# Patient Record
Sex: Female | Born: 1975 | Race: White | Hispanic: No | Marital: Married | Smoking: Never smoker
Health system: Southern US, Community
[De-identification: ages and names within clinical notes are randomized; demographics above are authoritative.]

## PROBLEM LIST (undated history)

## (undated) DIAGNOSIS — N939 Abnormal uterine and vaginal bleeding, unspecified: Secondary | ICD-10-CM

## (undated) DIAGNOSIS — E079 Disorder of thyroid, unspecified: Secondary | ICD-10-CM

## (undated) HISTORY — PX: THYROIDECTOMY: SHX17

## (undated) HISTORY — DX: Disorder of thyroid, unspecified: E07.9

## (undated) HISTORY — PX: TUBAL LIGATION: SHX77

---

## 2008-06-04 ENCOUNTER — Ambulatory Visit: Payer: Self-pay | Admitting: Obstetrics and Gynecology

## 2008-06-07 ENCOUNTER — Inpatient Hospital Stay: Payer: Self-pay

## 2012-02-07 ENCOUNTER — Ambulatory Visit: Payer: Self-pay

## 2017-03-14 ENCOUNTER — Other Ambulatory Visit: Payer: Self-pay | Admitting: Obstetrics and Gynecology

## 2017-03-14 DIAGNOSIS — Z1231 Encounter for screening mammogram for malignant neoplasm of breast: Secondary | ICD-10-CM

## 2017-03-21 ENCOUNTER — Encounter: Payer: Self-pay | Admitting: Radiology

## 2017-03-21 ENCOUNTER — Ambulatory Visit
Admission: RE | Admit: 2017-03-21 | Discharge: 2017-03-21 | Disposition: A | Discharge status: Home / Self Care | Payer: BLUE CROSS/BLUE SHIELD | Source: Ambulatory Visit | Attending: Obstetrics and Gynecology | Admitting: Obstetrics and Gynecology

## 2017-03-21 DIAGNOSIS — Z1231 Encounter for screening mammogram for malignant neoplasm of breast: Secondary | ICD-10-CM | POA: Diagnosis not present

## 2017-04-14 DIAGNOSIS — E041 Nontoxic single thyroid nodule: Secondary | ICD-10-CM | POA: Insufficient documentation

## 2017-10-11 DIAGNOSIS — Z9889 Other specified postprocedural states: Secondary | ICD-10-CM | POA: Insufficient documentation

## 2018-02-19 ENCOUNTER — Other Ambulatory Visit: Payer: Self-pay | Admitting: Obstetrics and Gynecology

## 2018-02-19 DIAGNOSIS — Z1231 Encounter for screening mammogram for malignant neoplasm of breast: Secondary | ICD-10-CM

## 2018-03-24 ENCOUNTER — Ambulatory Visit
Admission: RE | Admit: 2018-03-24 | Discharge: 2018-03-24 | Disposition: A | Discharge status: Home / Self Care | Payer: BLUE CROSS/BLUE SHIELD | Source: Ambulatory Visit | Attending: Obstetrics and Gynecology | Admitting: Obstetrics and Gynecology

## 2018-03-24 ENCOUNTER — Encounter: Payer: Self-pay | Admitting: Radiology

## 2018-03-24 DIAGNOSIS — Z1231 Encounter for screening mammogram for malignant neoplasm of breast: Secondary | ICD-10-CM

## 2019-02-24 ENCOUNTER — Other Ambulatory Visit: Payer: Self-pay | Admitting: Obstetrics and Gynecology

## 2019-02-24 DIAGNOSIS — Z1231 Encounter for screening mammogram for malignant neoplasm of breast: Secondary | ICD-10-CM

## 2019-09-24 ENCOUNTER — Ambulatory Visit
Admission: RE | Admit: 2019-09-24 | Discharge: 2019-09-24 | Disposition: A | Discharge status: Home / Self Care | Payer: No Typology Code available for payment source | Source: Ambulatory Visit | Attending: Obstetrics and Gynecology | Admitting: Obstetrics and Gynecology

## 2019-09-24 DIAGNOSIS — Z1231 Encounter for screening mammogram for malignant neoplasm of breast: Secondary | ICD-10-CM | POA: Diagnosis present

## 2019-10-05 ENCOUNTER — Ambulatory Visit: Payer: No Typology Code available for payment source | Attending: Internal Medicine

## 2019-10-05 DIAGNOSIS — Z20822 Contact with and (suspected) exposure to covid-19: Secondary | ICD-10-CM

## 2019-10-06 LAB — NOVEL CORONAVIRUS, NAA: SARS-CoV-2, NAA: NOT DETECTED

## 2019-10-12 ENCOUNTER — Ambulatory Visit: Payer: No Typology Code available for payment source | Attending: Internal Medicine

## 2019-10-12 DIAGNOSIS — Z20822 Contact with and (suspected) exposure to covid-19: Secondary | ICD-10-CM

## 2019-10-13 LAB — NOVEL CORONAVIRUS, NAA: SARS-CoV-2, NAA: NOT DETECTED

## 2019-12-22 ENCOUNTER — Other Ambulatory Visit: Payer: Self-pay

## 2019-12-22 ENCOUNTER — Encounter: Payer: Self-pay | Admitting: Emergency Medicine

## 2019-12-22 ENCOUNTER — Emergency Department: Payer: No Typology Code available for payment source

## 2019-12-22 ENCOUNTER — Emergency Department
Admission: EM | Admit: 2019-12-22 | Discharge: 2019-12-23 | Disposition: A | Discharge status: Home / Self Care | Payer: No Typology Code available for payment source | Attending: Emergency Medicine | Admitting: Emergency Medicine

## 2019-12-22 DIAGNOSIS — Y999 Unspecified external cause status: Secondary | ICD-10-CM | POA: Diagnosis not present

## 2019-12-22 DIAGNOSIS — Y92242 Post office as the place of occurrence of the external cause: Secondary | ICD-10-CM | POA: Insufficient documentation

## 2019-12-22 DIAGNOSIS — Y939 Activity, unspecified: Secondary | ICD-10-CM | POA: Diagnosis not present

## 2019-12-22 DIAGNOSIS — S2231XA Fracture of one rib, right side, initial encounter for closed fracture: Secondary | ICD-10-CM | POA: Insufficient documentation

## 2019-12-22 DIAGNOSIS — W0110XA Fall on same level from slipping, tripping and stumbling with subsequent striking against unspecified object, initial encounter: Secondary | ICD-10-CM | POA: Diagnosis not present

## 2019-12-22 DIAGNOSIS — S299XXA Unspecified injury of thorax, initial encounter: Secondary | ICD-10-CM | POA: Diagnosis present

## 2019-12-22 DIAGNOSIS — R0781 Pleurodynia: Secondary | ICD-10-CM

## 2019-12-22 DIAGNOSIS — W19XXXA Unspecified fall, initial encounter: Secondary | ICD-10-CM

## 2019-12-22 MED ORDER — KETOROLAC TROMETHAMINE 30 MG/ML IJ SOLN
10.0000 mg | Freq: Once | INTRAMUSCULAR | Status: AC
  Administered 2019-12-22: 9.9 mg via INTRAVENOUS
  Filled 2019-12-22: qty 1

## 2019-12-22 MED ORDER — SODIUM CHLORIDE 0.9 % IV BOLUS
1000.0000 mL | Freq: Once | INTRAVENOUS | Status: AC
  Administered 2019-12-22: 1000 mL via INTRAVENOUS

## 2019-12-22 MED ORDER — OXYCODONE-ACETAMINOPHEN 5-325 MG PO TABS
1.0000 | ORAL_TABLET | Freq: Once | ORAL | Status: AC
  Administered 2019-12-22: 1 via ORAL
  Filled 2019-12-22: qty 1

## 2019-12-22 NOTE — ED Notes (Signed)
Pt to the er for pain to the right flank and upper abd at the ribs. Pt states she fell and now has pain along the ribs. Worse pain with movement and deep breath.

## 2019-12-22 NOTE — ED Notes (Signed)
Husband notified to come to room.

## 2019-12-22 NOTE — ED Provider Notes (Signed)
[facility] Emergency Department Provider Note   ____________________________________________   First MD Initiated Contact with Patient 12/22/19 2326     (approximate)  I have reviewed the triage vital signs and the nursing notes.   HISTORY  Chief Complaint Fall    HPI Alison Stein is a 44 y.o. female who presents to the ED from home with a chief complaint of right side pain.  Patient tripped and fell while at the [employer] yesterday.  No striking head or LOC.  Had been managing her pain today with ibuprofen but pain progressed; increases with deep breathing and movements.  Denies anticoagulant use.  Denies abdominal pain, nausea, vomiting, dizziness.       Past medical history None  There are no problems to display for this patient.   Past Surgical History:  Procedure Laterality Date  . CESAREAN SECTION      Prior to Admission medications   Not on File    Allergies Penicillins  Family History  Problem Relation Age of Onset  . Breast cancer Neg Hx     Social History Social History   Tobacco Use  . Smoking status: Never Smoker  . Smokeless tobacco: Never Used  Substance Use Topics  . Alcohol use: Not on file  . Drug use: Not on file    Review of Systems  Constitutional: No fever/chills Eyes: No visual changes. ENT: No sore throat. Cardiovascular: Positive for right rib pain. Respiratory: Denies shortness of breath. Gastrointestinal: No abdominal pain.  No nausea, no vomiting.  No diarrhea.  No constipation. Genitourinary: Negative for dysuria. Musculoskeletal: Negative for back pain. Skin: Negative for rash. Neurological: Negative for headaches, focal weakness or numbness.   ____________________________________________   PHYSICAL EXAM:  VITAL SIGNS: ED Triage Vitals  Enc Vitals Group     BP 12/22/19 2312 140/85     Pulse Rate 12/22/19 2312 (!) 115     Resp 12/22/19 2312 (!) 32     Temp 12/22/19 2312 98 F  (36.7 C)     Temp Source 12/22/19 2312 Oral     SpO2 12/22/19 2312 98 %     Weight 12/22/19 2307 230 lb (104.3 kg)     Height 12/22/19 2307 5\' 2"  (1.575 m)     Head Circumference --      Peak Flow --      Pain Score 12/22/19 2307 10     Pain Loc --      Pain Edu? --      Excl. in [place]? --     Constitutional: Alert and oriented. Well appearing and in mild to moderate acute distress. Eyes: Conjunctivae are normal. PERRL. EOMI. Head: Atraumatic. Nose: Atraumatic. Mouth/Throat: Mucous membranes are moist.  No dental malocclusion. Neck: No stridor.  No cervical spine tenderness to palpation. Cardiovascular: Normal rate, regular rhythm. Grossly normal heart sounds.  Good peripheral circulation. Respiratory: Normal respiratory effort.  No retractions. Lungs CTAB.  Splinting. Point tenderness to right lower lateral ribs.  No crepitus. Gastrointestinal: Soft and nontender to light or deep palpation without rebound or guarding, particularly in right upper quadrant. No distention. No abdominal bruits. No CVA tenderness. Musculoskeletal: No lower extremity tenderness nor edema.  No joint effusions. Neurologic:  Normal speech and language. No gross focal neurologic deficits are appreciated. No gait instability. Skin:  Skin is warm, dry and intact. No rash noted. Psychiatric: Mood and affect are normal. Speech and behavior are normal.  ____________________________________________   LABS (all labs  ordered are listed, but only abnormal results are displayed)  Labs Reviewed - No data to display ____________________________________________  EKG  ED ECG REPORT I, Kacie Huxtable J, the attending physician, personally viewed and interpreted this ECG.   Date: 12/23/2019  EKG Time: 2319  Rate: 105  Rhythm: sinus tachycardia  Axis: Normal  Intervals:none  ST&T Change: Nonspecific  ____________________________________________  RADIOLOGY  ED MD interpretation: Probable nondisplaced ninth rib  fracture; no pneumothorax  Official radiology report(s): DG Ribs Unilateral W/Chest Right  Result Date: 12/23/2019 CLINICAL DATA:  Pain status post fall EXAM: RIGHT RIBS AND CHEST - 3+ VIEW COMPARISON:  None. FINDINGS: Findings are suspicious for nondisplaced fracture involving the anterolateral ninth rib on the right. There is no evidence of pneumothorax or pleural effusion. Both lungs are clear. Heart size and mediastinal contours are within normal limits. IMPRESSION: Findings suspicious for nondisplaced fracture involving the anterolateral right ninth rib. No evidence of pneumothorax or pleural effusion. Electronically Signed   By: Katherine Mantle M.D.   On: 12/23/2019 00:18    ____________________________________________   PROCEDURES  Procedure(s) performed (including Critical Care):  .1-3 Lead EKG Interpretation Performed by: Irean Hong, MD Authorized by: Irean Hong, MD     Interpretation: normal     ECG rate:  102   ECG rate assessment: tachycardic     Rhythm: sinus tachycardia     Ectopy: none     Conduction: normal       ____________________________________________   INITIAL IMPRESSION / ASSESSMENT AND PLAN / ED COURSE  As part of my medical decision making, I reviewed the following data within the electronic MEDICAL RECORD NUMBER Nursing notes reviewed and incorporated, Labs reviewed, Old chart reviewed, Radiograph reviewed and Notes from prior ED visits     Alison Stein was evaluated in Emergency Department on 12/23/2019 for the symptoms described in the history of present illness. She was evaluated in the context of the global COVID-19 pandemic, which necessitated consideration that the patient might be at risk for infection with the SARS-CoV-2 virus that causes COVID-19. Institutional protocols and algorithms that pertain to the evaluation of patients at risk for COVID-19 are in a state of rapid change based on information released by regulatory bodies including the  CDC and federal and state organizations. These policies and algorithms were followed during the patient's care in the ED.    44 year old female who presents with right ribs pain status post mechanical fall yesterday.  Differential diagnosis includes but is not limited to rib contusion, rib fracture, pneumothorax, liver injury, etc.  Will obtain right rib x-ray series, administer NSAIDs, analgesia and reassess.   Clinical Course as of Dec 23 22  Wed Dec 23, 2019  0022 Pain improved.  Updated patient on x-ray results.  Will discharge home with incentive spirometer, analgesia and patient will follow up closely with her PCP.  Strict return precautions given.  Patient verbalizes understanding and agrees with plan of care.   [JS]    Clinical Course User Index [JS] Irean Hong, MD     ____________________________________________   FINAL CLINICAL IMPRESSION(S) / ED DIAGNOSES  Final diagnoses:  Fall, initial encounter  Rib pain on right side  Closed fracture of one rib of right side, initial encounter     ED Discharge Orders    None       Note:  This document was prepared using Dragon voice recognition software and may include unintentional dictation errors.   Irean Hong, MD 12/23/19  0300  

## 2019-12-22 NOTE — ED Triage Notes (Signed)
Pt to triage via w/c, mask in place; appears uncomfortable, grunting; reports fell yesterday; c/o increasing pain to rt side upper abd/chest that increases with deep breathing and movement; denies any other c/o or injuries

## 2019-12-23 MED ORDER — OXYCODONE-ACETAMINOPHEN 5-325 MG PO TABS: 1.0000 | ORAL_TABLET | ORAL | 0 refills | Status: DC | PRN

## 2019-12-23 NOTE — Discharge Instructions (Addendum)
1.  Continue Ibuprofen as needed for pain; Percocet as needed for more severe pain. 2.  Use incentive spirometer as instructed. 3.  Return to the ER for worsening symptoms, persistent vomiting, fever, difficulty breathing or other concerns.

## 2020-10-05 ENCOUNTER — Other Ambulatory Visit: Payer: Self-pay | Admitting: Obstetrics and Gynecology

## 2020-10-05 DIAGNOSIS — Z1231 Encounter for screening mammogram for malignant neoplasm of breast: Secondary | ICD-10-CM

## 2020-11-03 ENCOUNTER — Ambulatory Visit
Admission: RE | Admit: 2020-11-03 | Discharge: 2020-11-03 | Disposition: A | Discharge status: Home / Self Care | Payer: BC Managed Care – PPO | Source: Ambulatory Visit | Attending: Obstetrics and Gynecology | Admitting: Obstetrics and Gynecology

## 2020-11-03 ENCOUNTER — Other Ambulatory Visit: Payer: Self-pay

## 2020-11-03 DIAGNOSIS — Z1231 Encounter for screening mammogram for malignant neoplasm of breast: Secondary | ICD-10-CM | POA: Diagnosis present

## 2021-02-20 IMAGING — MG DIGITAL SCREENING BILAT W/ TOMO W/ CAD
6 of 10 series · 6 of 30 positions shown · non-contrast
Comparison: Previous exam(s).

ACR Breast Density Category a: The breast tissue is almost entirely
fatty.

CLINICAL DATA: Screening.

EXAM:
DIGITAL SCREENING BILATERAL MAMMOGRAM WITH TOMO AND CAD

[L MLO synth-2D]
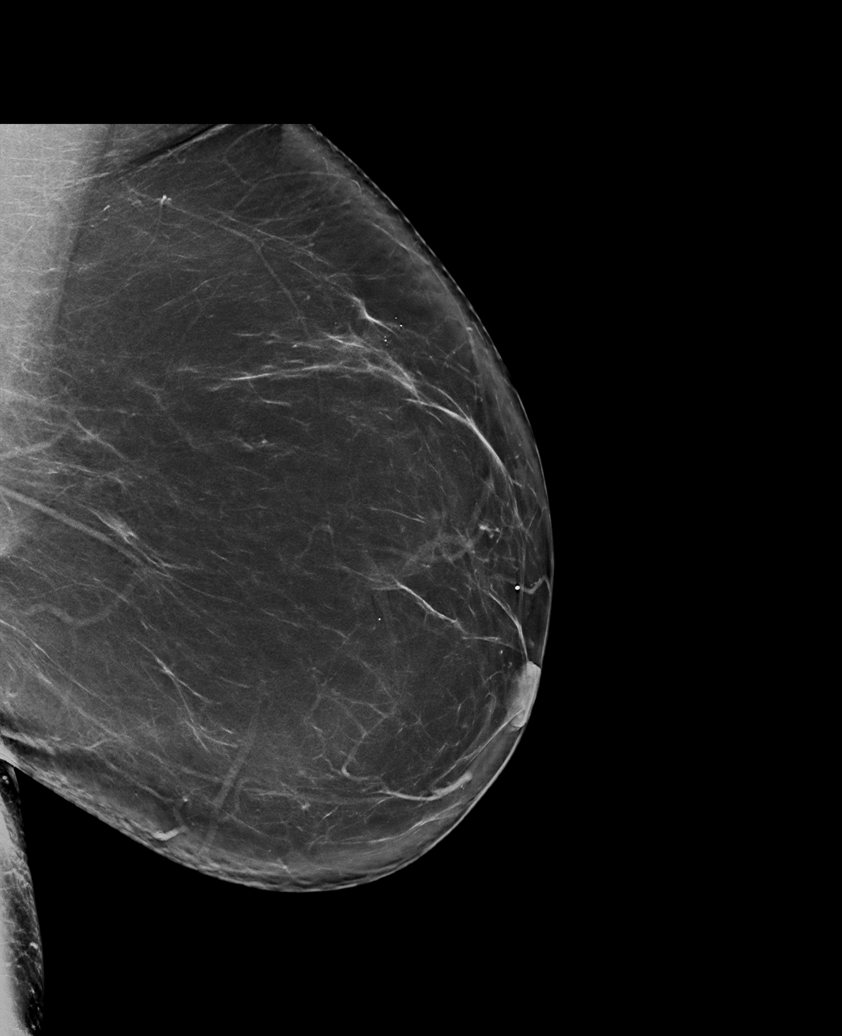

[R CC synth-2D (1 of 2)]
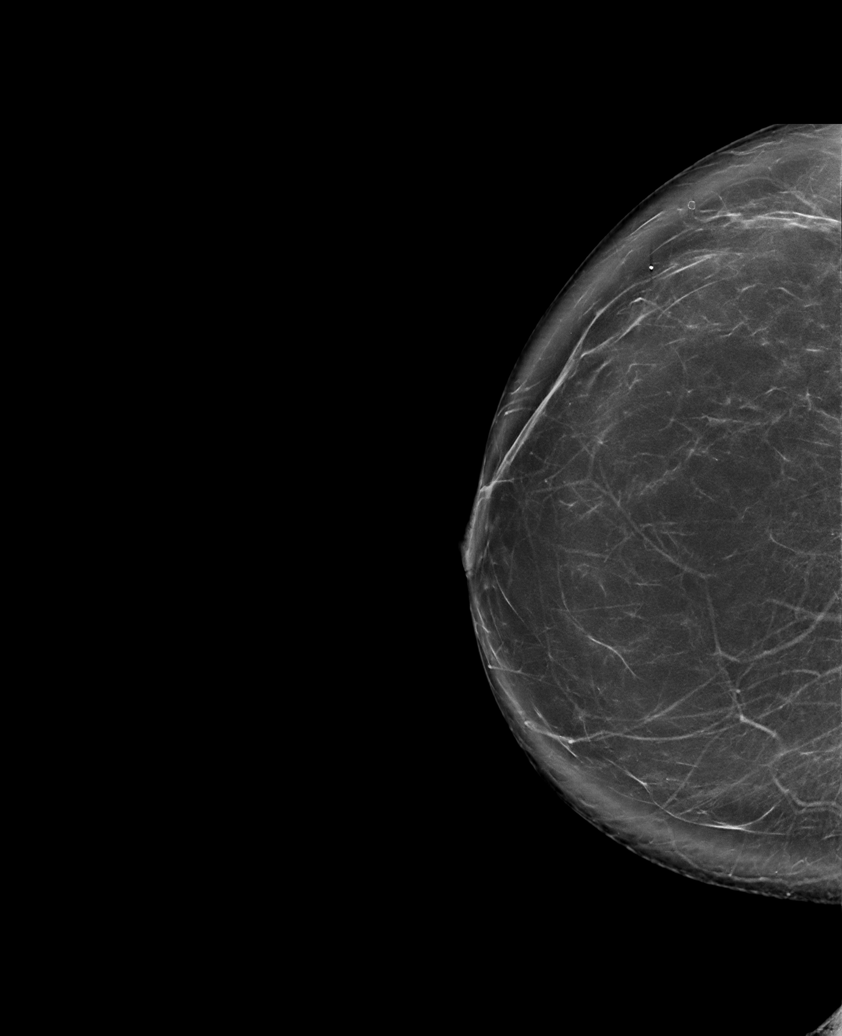

[L CC synth-2D]
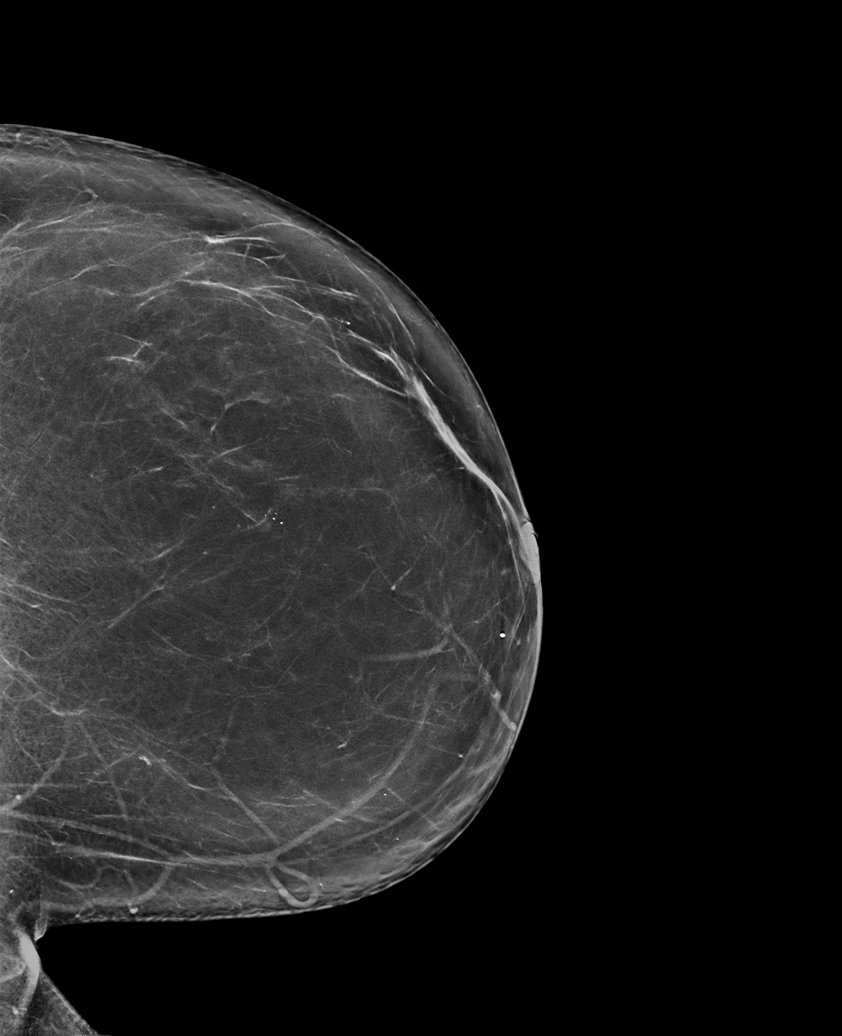

[R CC synth-2D (2 of 2)]
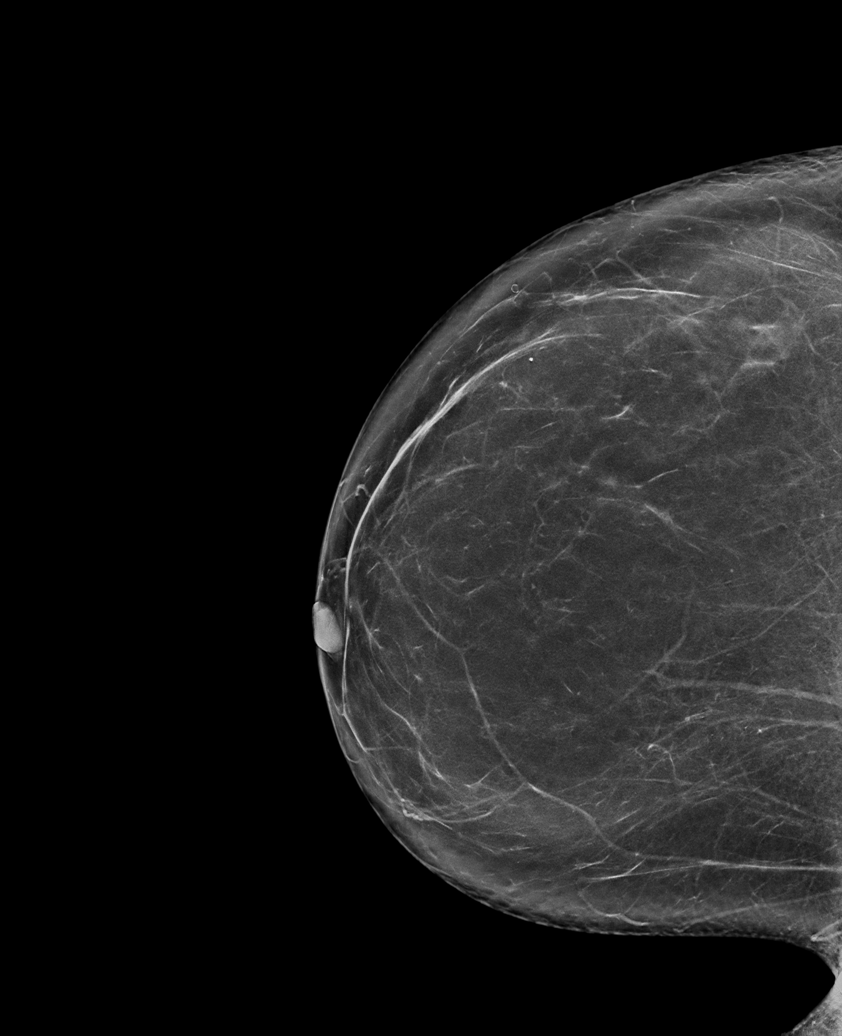

[R MLO synth-2D]
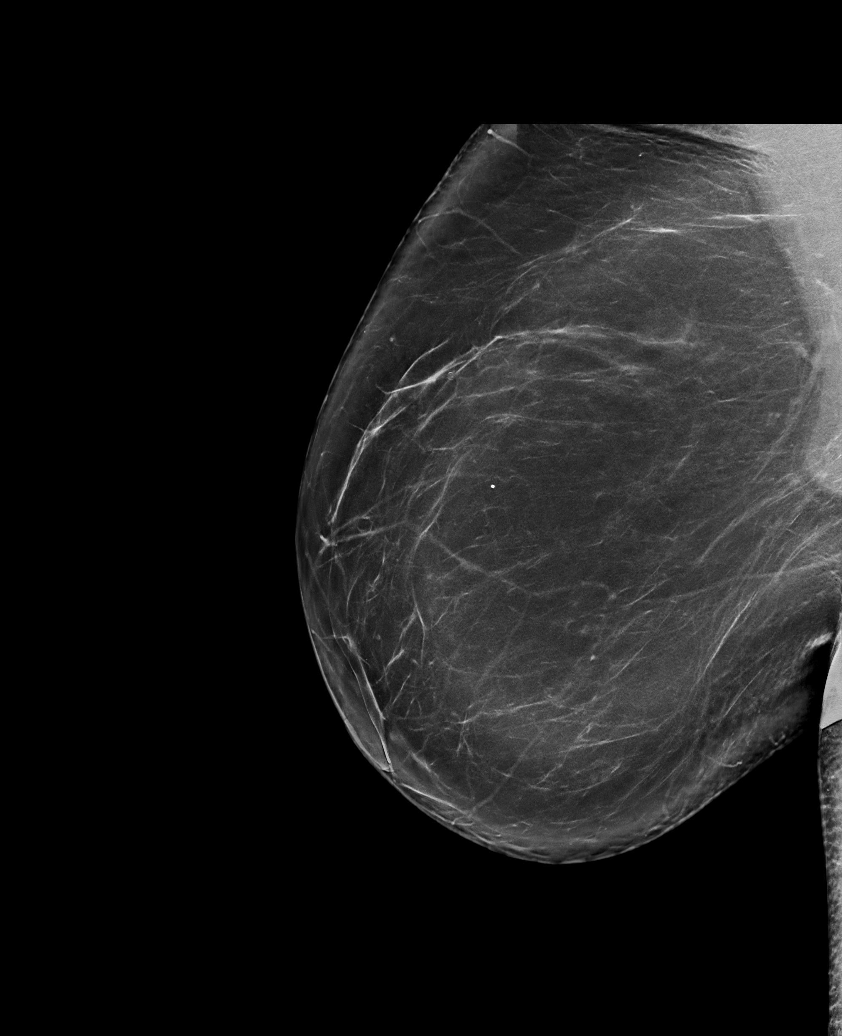

[R CC tomo · tomo slice 41/81.0]
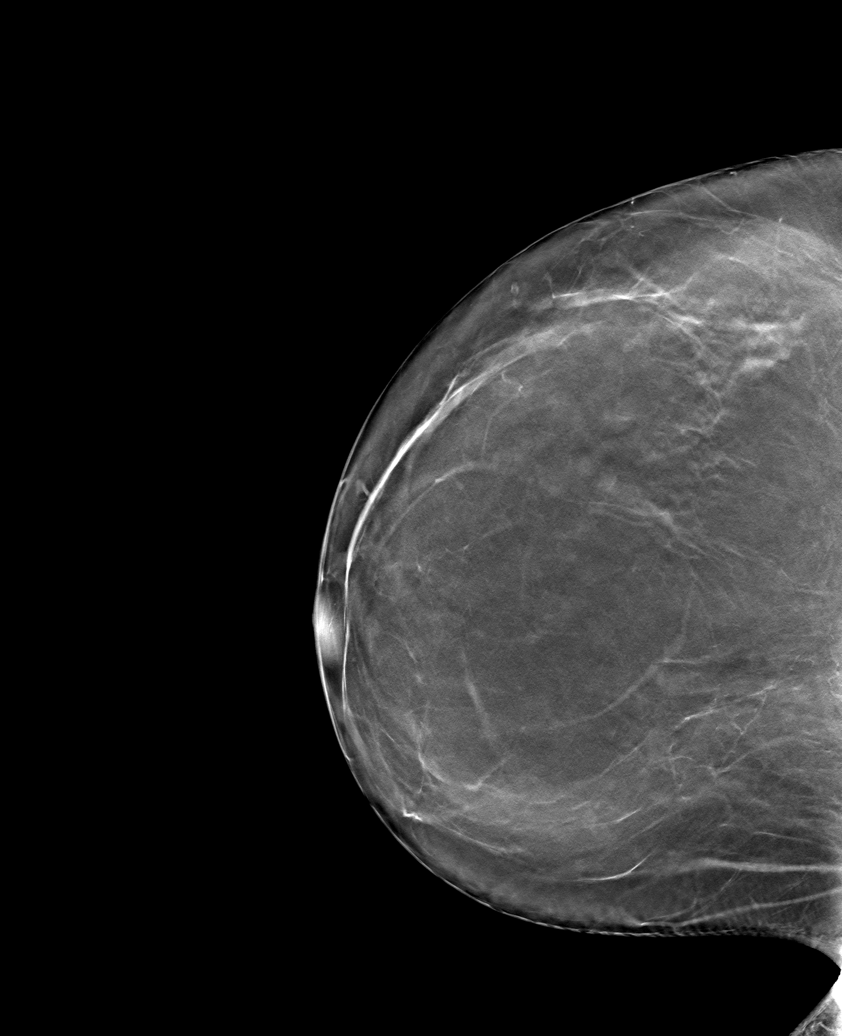

[6 of 30 positions shown; findings below may reference images not displayed]

FINDINGS: There are no findings suspicious for malignancy. Images were
processed with CAD.
IMPRESSION: No mammographic evidence of malignancy. A result letter of this
screening mammogram will be mailed directly to the patient.

RECOMMENDATION:
Screening mammogram in one year. (Code:[id number]-[id number])

BI-RADS CATEGORY  1: Negative.

## 2021-12-18 ENCOUNTER — Other Ambulatory Visit: Payer: Self-pay | Admitting: Obstetrics and Gynecology

## 2021-12-18 DIAGNOSIS — Z1231 Encounter for screening mammogram for malignant neoplasm of breast: Secondary | ICD-10-CM

## 2022-01-23 ENCOUNTER — Ambulatory Visit
Admission: RE | Admit: 2022-01-23 | Discharge: 2022-01-23 | Disposition: A | Discharge status: Home / Self Care | Payer: BC Managed Care – PPO | Source: Ambulatory Visit | Attending: Obstetrics and Gynecology | Admitting: Obstetrics and Gynecology

## 2022-01-23 DIAGNOSIS — Z1231 Encounter for screening mammogram for malignant neoplasm of breast: Secondary | ICD-10-CM | POA: Diagnosis present

## 2022-03-26 ENCOUNTER — Other Ambulatory Visit (HOSPITAL_COMMUNITY): Payer: Self-pay | Admitting: Physical Medicine & Rehabilitation

## 2022-03-26 ENCOUNTER — Other Ambulatory Visit: Payer: Self-pay | Admitting: Physical Medicine & Rehabilitation

## 2022-03-26 DIAGNOSIS — G8929 Other chronic pain: Secondary | ICD-10-CM

## 2022-04-03 ENCOUNTER — Ambulatory Visit
Admission: RE | Admit: 2022-04-03 | Discharge: 2022-04-03 | Disposition: A | Discharge status: Home / Self Care | Payer: BC Managed Care – PPO | Source: Ambulatory Visit | Attending: Physical Medicine & Rehabilitation | Admitting: Physical Medicine & Rehabilitation

## 2022-04-03 DIAGNOSIS — G8929 Other chronic pain: Secondary | ICD-10-CM | POA: Insufficient documentation

## 2022-04-03 DIAGNOSIS — M5441 Lumbago with sciatica, right side: Secondary | ICD-10-CM | POA: Insufficient documentation

## 2022-04-03 DIAGNOSIS — M5442 Lumbago with sciatica, left side: Secondary | ICD-10-CM | POA: Diagnosis present

## 2022-05-01 LAB — HM PAP SMEAR: HM Pap smear: NEGATIVE

## 2023-08-26 ENCOUNTER — Ambulatory Visit: Payer: BC Managed Care – PPO | Admitting: Family Medicine

## 2023-08-26 ENCOUNTER — Other Ambulatory Visit: Payer: Self-pay | Admitting: Family Medicine

## 2023-08-26 ENCOUNTER — Encounter: Payer: Self-pay | Admitting: Family Medicine

## 2023-08-26 VITALS — BP 138/86 | Ht 62.0 in | Wt 227.0 lb

## 2023-08-26 DIAGNOSIS — E042 Nontoxic multinodular goiter: Secondary | ICD-10-CM

## 2023-08-26 DIAGNOSIS — Z7689 Persons encountering health services in other specified circumstances: Secondary | ICD-10-CM | POA: Diagnosis not present

## 2023-08-26 DIAGNOSIS — Z6841 Body Mass Index (BMI) 40.0 and over, adult: Secondary | ICD-10-CM

## 2023-08-26 DIAGNOSIS — R030 Elevated blood-pressure reading, without diagnosis of hypertension: Secondary | ICD-10-CM | POA: Diagnosis not present

## 2023-08-26 DIAGNOSIS — N939 Abnormal uterine and vaginal bleeding, unspecified: Secondary | ICD-10-CM | POA: Insufficient documentation

## 2023-08-26 DIAGNOSIS — Z9889 Other specified postprocedural states: Secondary | ICD-10-CM | POA: Diagnosis not present

## 2023-08-26 DIAGNOSIS — Z0000 Encounter for general adult medical examination without abnormal findings: Secondary | ICD-10-CM

## 2023-08-26 DIAGNOSIS — E039 Hypothyroidism, unspecified: Secondary | ICD-10-CM

## 2023-08-26 DIAGNOSIS — Z131 Encounter for screening for diabetes mellitus: Secondary | ICD-10-CM

## 2023-08-26 NOTE — Progress Notes (Signed)
Subjective:    Patient ID: Alison Stein, female    DOB: Dec 13, 1975, 47 y.o.   MRN: [id number]  Alison Stein is a 47 y.o. female presenting on 08/26/2023 for Annual Exam (New patient needs a PCP)  No regular PCP anymore. Here to establish.  HPI  Discussed the use of AI scribe software for clinical note transcription with the patient, who gave verbal consent to proceed.  Establish care The patient's medical history includes a multinodular goiter and subsequent thyroid surgery, managed by endocrinology with levothyroxine replacement therapy. The patient also receives care from a physiatrist for back pain, which was recently treated with two injections. Additionally, the patient is under the care of a gynecologist.  Elevated BP without hypertension The patient expressed concern about elevated blood pressure readings, which have been borderline high in recent measurements. 130/80-90s, she does not check it but reviewed outside records. - The patient prefers to manage this without additional medication, expressing a general aversion to taking pills. The patient also expressed concern about potential diabetes, which has not been diagnosed but is a source of worry due to the implications of additional medication.  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 The patient acknowledged the need for lifestyle changes, including potential weight loss and increased physical activity, to manage her health concerns.  Rectal bleeding, history No longer active The patient is scheduled for a consultation with a gastroenterologist due to a history of rectal bleeding, suspected to be due to hemorrhoids. The patient expressed apprehension about the invasiveness of a colonoscopy and is considering alternative screening methods such as Cologuard.  The patient's family history includes a mother with a history of stroke and possible  hypertension. The patient expressed concern about the potential for osteoporosis, which is a known side effect of her thyroid medication and a condition that runs in her family. The patient is considering supplementing with calcium and vitamin D to mitigate this risk.    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 [employer] Physically active at work but not at exercise level Goal to improve exercise, goal to improve motivation       08/26/2023   10:21 AM  Depression screen PHQ 2/9  Decreased Interest 0  Down, Depressed, Hopeless 0  PHQ - 2 Score 0  Altered sleeping 1  Tired, decreased energy 1  Change in appetite 1  Feeling bad or failure about yourself  0  Trouble concentrating 0  Moving slowly or fidgety/restless 0  Suicidal thoughts 0  PHQ-9 Score 3  Difficult doing work/chores Not difficult at all       08/26/2023   10:21 AM  GAD 7 : Generalized Anxiety Score  Nervous, Anxious, on Edge 0  Control/stop worrying 0  Worry too much - different things 0  Trouble relaxing 0  Restless 0  Easily annoyed or irritable 0  Afraid - awful might happen 0  Total GAD 7 Score 0     Past Medical History:  Diagnosis Date   Thyroid disease    Past Surgical History:  Procedure Laterality Date   CESAREAN SECTION     THYROIDECTOMY     Social History   Socioeconomic History   Marital status: Married    Spouse name: Not on file   Number of children: Not  on file   Years of education: Not on file   Highest education level: Not on file  Occupational History   Not on file  Tobacco Use   Smoking status: Never   Smokeless tobacco: Never  Substance and Sexual Activity   Alcohol use: Never   Drug use: Not on file   Sexual activity: Not on file  Other Topics Concern   Not on file  Social History Narrative   Not on file   Social Determinants of  Health   Financial Resource Strain: Not on file  Food Insecurity: Not on file  Transportation Needs: Not on file  Physical Activity: Not on file  Stress: Not on file  Social Connections: Not on file  Intimate Partner Violence: Not on file   Family History  Problem Relation Age of Onset   Breast cancer Neg Hx    Current Outpatient Medications on File Prior to Visit  Medication Sig   levothyroxine (SYNTHROID) 50 MCG tablet Take 50 mcg by mouth.   oxyCODONE-acetaminophen (PERCOCET/ROXICET) 5-325 MG tablet Take 1 tablet by mouth every 4 (four) hours as needed for severe pain. (Patient not taking: Reported on 08/26/2023)   No current facility-administered medications on file prior to visit.    Review of Systems Per HPI unless specifically indicated above     Objective:    BP 138/86 (BP Location: Left Arm, Cuff Size: Large)   Ht 5\' 2"  (1.575 m)   Wt 227 lb (103 kg)   BMI 41.52 kg/m   Wt Readings from Last 3 Encounters:  08/26/23 227 lb (103 kg)  12/22/19 230 lb (104.3 kg)    Physical Exam Vitals and nursing note reviewed.  Constitutional:      General: She is not in acute distress.    Appearance: Normal appearance. She is well-developed. She is obese. She is not diaphoretic.     Comments: Well-appearing, comfortable, cooperative  HENT:     Head: Normocephalic and atraumatic.  Eyes:     General:        Right eye: No discharge.        Left eye: No discharge.     Conjunctiva/sclera: Conjunctivae normal.  Cardiovascular:     Rate and Rhythm: Normal rate.  Pulmonary:     Effort: Pulmonary effort is normal.  Skin:    General: Skin is warm and dry.     Findings: No erythema or rash.  Neurological:     Mental Status: She is alert and oriented to person, place, and time.  Psychiatric:        Mood and Affect: Mood normal.        Behavior: Behavior normal.        Thought Content: Thought content normal.     Comments: Well groomed, good eye contact, normal speech and  thoughts     Results for orders placed or performed in visit on 10/12/19  Novel Coronavirus, NAA ([facility])   Specimen: Nasopharyngeal(NP) swabs in vial transport medium   NASOPHARYNGE  TESTING  Result Value Ref Range   SARS-CoV-2, NAA Not Detected Not Detected      Assessment & Plan:   Problem List Items Addressed This Visit     Elevated BP without diagnosis of hypertension   Hypothyroidism (acquired)   Relevant Medications   levothyroxine (SYNTHROID) 50 MCG tablet   Morbid obesity with BMI of 40.0-44.9, adult (HCC) - Primary   S/P thyroid surgery   Other Visit Diagnoses     Encounter to  establish care with new doctor       Multinodular goiter       Relevant Medications   levothyroxine (SYNTHROID) 50 MCG tablet        Updated Health Maintenance information Reviewed recent lab results with patient Encouraged improvement to lifestyle with diet and exercise Goal of weight loss   ELEVATED BP without HTN Borderline elevated blood pressure readings (138/92 and 138/86). Discussed the importance of lifestyle modifications and the potential need for medication if blood pressure remains elevated. -Monitor blood pressure. -Consider lifestyle modifications including diet and exercise.  Hypothyroidism / s/p surgery Thyroid Disorder Managed by endocrinology. Patient expressed concern about potential osteoporosis risk with levothyroxine. -Continue current management with endocrinology on Levothyroxine daily  Colorectal Cancer Screening Discussed options for colorectal cancer screening including colonoscopy and Cologuard. Patient has an appointment with GI in January. -Consider Cologuard if patient prefers not to undergo colonoscopy.  Risk of Diabetes Patient expressed concern about potential risk of diabetes. -Order blood work including glucose level to assess for diabetes upcoming  Follow-up Scheduled for comprehensive physical and lab work in March. -Schedule  follow-up appointment in March for physical and lab work. -Order comprehensive lab work including kidney function, liver function, glucose, and cholesterol.         No orders of the defined types were placed in this encounter.   No orders of the defined types were placed in this encounter.    Follow up plan: Return in about 3 months (around 11/24/2023) for 3 month fasting lab > 1 week later Annual Physical.  Future lab 11/25/23 CMET Lipid CBC A1c   Saralyn Pilar, DO [facility] 08/26/2023, 10:55 AM

## 2023-08-26 NOTE — Addendum Note (Signed)
Addended by: Smitty Cords on: 08/26/2023 01:00 PM   Modules accepted: Orders

## 2023-08-26 NOTE — Patient Instructions (Addendum)
Thank you for coming to the office today.  BP improved on repeat.  We can continue to monitor, goal < 140 / 90 or even 135 / 85   Colon Cancer Screening: - For all adults age 46+ routine colon cancer screening is highly recommended.     - Recent guidelines from American Cancer Society recommend starting age of 38 - Early detection of colon cancer is important, because often there are no warning signs or symptoms, also if found early usually it can be cured. Late stage is hard to treat.  - If you are not interested in Colonoscopy screening (if done and normal you could be cleared for 5 to 10 years until next due), then Cologuard is an excellent alternative for screening test for Colon Cancer. It is highly sensitive for detecting DNA of colon cancer from even the earliest stages. Also, there is NO bowel prep required. - If Cologuard is NEGATIVE, then it is good for 3 years before next due - If Cologuard is POSITIVE, then it is strongly advised to get a Colonoscopy, which allows the GI doctor to locate the source of the cancer or polyp (even very early stage) and treat it by removing it. ------------------------- If you would like to proceed with Cologuard (stool DNA test) - FIRST, call your insurance company and tell them you want to check cost of Cologuard tell them CPT Code 95284 (it may be completely covered and you could get for no cost, OR max cost without any coverage is about $600). Also, keep in mind if you do NOT open the kit, and decide not to do the test, you will NOT be charged, you should contact the company if you decide not to do the test. - If you want to proceed, you can notify us (phone message, MyChart Message, or at next visit) and we will order it for you. The test kit will be delivered to you house within about 1 week. Follow instructions to collect sample, you may call the company for any help or questions, 24/7 telephone support at  [phone].  --------------------------------------  IF you want to proceed - contact message or call Cologuard (home kit) test for colon cancer screening. Stay tuned for further updates.  It will be shipped to you directly. If not received in 2-4 weeks, call us or the company.   If you send it back and no results are received in 2-4 weeks, call us or the company as well!   Colon Cancer Screening: - For all adults age 90+ routine colon cancer screening is highly recommended.     - Recent guidelines from American Cancer Society recommend starting age of 16 - Early detection of colon cancer is important, because often there are no warning signs or symptoms, also if found early usually it can be cured. Late stage is hard to treat.   - If Cologuard is NEGATIVE, then it is good for 3 years before next due - If Cologuard is POSITIVE, then it is strongly advised to get a Colonoscopy, which allows the GI doctor to locate the source of the cancer or polyp (even very early stage) and treat it by removing it. ------------------------- Follow instructions to collect sample, you may call the company for any help or questions, 24/7 telephone support at [phone].    DUE for FASTING BLOOD WORK (no food or drink after midnight before the lab appointment, only water or coffee without cream/sugar on the morning of)  SCHEDULE "Lab Only" visit in  the morning at the clinic for lab draw in  3 MONTHS   - Make sure Lab Only appointment is at about 1 week before your next appointment, so that results will be available  For Lab Results, once available within 2-3 days of blood draw, you can can log in to MyChart online to view your results and a brief explanation. Also, we can discuss results at next follow-up visit.   Please schedule a Follow-up Appointment to: Return in about 3 months (around 11/24/2023) for 3 month fasting lab > 1 week later Annual Physical.  If you have any other questions or  concerns, please feel free to call the office or send a message through MyChart. You may also schedule an earlier appointment if necessary.  Additionally, you may be receiving a survey about your experience at our office within a few days to 1 week by e-mail or mail. We value your feedback.  Saralyn Pilar, DO [facility], [place]

## 2023-11-25 ENCOUNTER — Other Ambulatory Visit: Payer: BC Managed Care – PPO

## 2023-11-25 DIAGNOSIS — E039 Hypothyroidism, unspecified: Secondary | ICD-10-CM

## 2023-11-25 DIAGNOSIS — Z0000 Encounter for general adult medical examination without abnormal findings: Secondary | ICD-10-CM

## 2023-11-25 DIAGNOSIS — Z131 Encounter for screening for diabetes mellitus: Secondary | ICD-10-CM

## 2023-11-25 DIAGNOSIS — E042 Nontoxic multinodular goiter: Secondary | ICD-10-CM

## 2023-11-25 DIAGNOSIS — R030 Elevated blood-pressure reading, without diagnosis of hypertension: Secondary | ICD-10-CM

## 2023-11-26 LAB — COMPLETE METABOLIC PANEL WITH GFR
AG Ratio: 1.7 (calc) (ref 1.0–2.5)
ALT: 27 U/L (ref 6–29)
AST: 16 U/L (ref 10–35)
Albumin: 4.6 g/dL (ref 3.6–5.1)
Alkaline phosphatase (APISO): 100 U/L (ref 31–125)
BUN: 14 mg/dL (ref 7–25)
CO2: 31 mmol/L (ref 20–32)
Calcium: 9.8 mg/dL (ref 8.6–10.2)
Chloride: 103 mmol/L (ref 98–110)
Creat: 0.69 mg/dL (ref 0.50–0.99)
Globulin: 2.7 g/dL (ref 1.9–3.7)
Glucose, Bld: 100 mg/dL — ABNORMAL HIGH (ref 65–99)
Potassium: 4.7 mmol/L (ref 3.5–5.3)
Sodium: 139 mmol/L (ref 135–146)
Total Bilirubin: 0.5 mg/dL (ref 0.2–1.2)
Total Protein: 7.3 g/dL (ref 6.1–8.1)
eGFR: 108 mL/min/{1.73_m2} (ref >=60)

## 2023-11-26 LAB — CBC WITH DIFFERENTIAL/PLATELET
Absolute Lymphocytes: 1820 {cells}/uL (ref 850–3900)
Absolute Monocytes: 460 {cells}/uL (ref 200–950)
Basophils Absolute: 30 {cells}/uL (ref 0–200)
Basophils Relative: 0.6 %
Eosinophils Absolute: 180 {cells}/uL (ref 15–500)
Eosinophils Relative: 3.6 %
HCT: 40.9 % (ref 35.0–45.0)
Hemoglobin: 13.5 g/dL (ref 11.7–15.5)
MCH: 29.5 pg (ref 27.0–33.0)
MCHC: 33 g/dL (ref 32.0–36.0)
MCV: 89.5 fL (ref 80.0–100.0)
MPV: 11 fL (ref 7.5–12.5)
Monocytes Relative: 9.2 %
Neutro Abs: 2510 {cells}/uL (ref 1500–7800)
Neutrophils Relative %: 50.2 %
Platelets: 288 10*3/uL (ref 140–400)
RBC: 4.57 10*6/uL (ref 3.80–5.10)
RDW: 12.7 % (ref 11.0–15.0)
Total Lymphocyte: 36.4 %
WBC: 5 10*3/uL (ref 3.8–10.8)

## 2023-11-26 LAB — LIPID PANEL
Cholesterol: 232 mg/dL — ABNORMAL HIGH (ref <200)
HDL: 54 mg/dL (ref >=50)
LDL Cholesterol (Calc): 149 mg/dL — ABNORMAL HIGH
Non-HDL Cholesterol (Calc): 178 mg/dL — ABNORMAL HIGH (ref <130)
Total CHOL/HDL Ratio: 4.3 (calc) (ref <5.0)
Triglycerides: 155 mg/dL — ABNORMAL HIGH (ref <150)

## 2023-11-26 LAB — HEMOGLOBIN A1C
Hgb A1c MFr Bld: 5.2 %{Hb} (ref <5.7)
Mean Plasma Glucose: 103 mg/dL
eAG (mmol/L): 5.7 mmol/L

## 2023-12-02 ENCOUNTER — Encounter: Payer: Self-pay | Admitting: Family Medicine

## 2023-12-02 ENCOUNTER — Ambulatory Visit (INDEPENDENT_AMBULATORY_CARE_PROVIDER_SITE_OTHER): Payer: BC Managed Care – PPO | Admitting: Family Medicine

## 2023-12-02 VITALS — BP 124/82 | HR 97 | Ht 62.0 in | Wt 230.0 lb

## 2023-12-02 DIAGNOSIS — Z0000 Encounter for general adult medical examination without abnormal findings: Secondary | ICD-10-CM | POA: Diagnosis not present

## 2023-12-02 DIAGNOSIS — Z1231 Encounter for screening mammogram for malignant neoplasm of breast: Secondary | ICD-10-CM

## 2023-12-02 DIAGNOSIS — E039 Hypothyroidism, unspecified: Secondary | ICD-10-CM | POA: Diagnosis not present

## 2023-12-02 DIAGNOSIS — Z6841 Body Mass Index (BMI) 40.0 and over, adult: Secondary | ICD-10-CM

## 2023-12-02 NOTE — Progress Notes (Unsigned)
 Subjective:    Patient ID: Alison Stein, female    DOB: 1975-11-20, 48 y.o.   MRN: [id number]  Alison Stein is a 48 y.o. female presenting on 12/02/2023 for No chief complaint on file.   HPI  Discussed the use of AI scribe software for clinical note transcription with the patient, who gave verbal consent to proceed.  History of Present Illness          ***  The 10-year ASCVD risk score (Arnett DK, et al., 2019) is: 1.2%   Values used to calculate the score:     Age: 89 years     Sex: Female     Is Non-Hispanic African American: No     Diabetic: No     Tobacco smoker: No     Systolic Blood Pressure: 124 mmHg     Is BP treated: No     HDL Cholesterol: 54 mg/dL     Total Cholesterol: 232 mg/dL   Establish care The patient's medical history includes a multinodular goiter and subsequent thyroid surgery, managed by endocrinology with levothyroxine replacement therapy. The patient also receives care from a physiatrist for back pain, which was recently treated with two injections. Additionally, the patient is under the care of a gynecologist.   Elevated BP without hypertension The patient expressed concern about elevated blood pressure readings, which have been borderline high in recent measurements. 130/80-90s, she does not check it but reviewed outside records. - The patient prefers to manage this without additional medication, expressing a general aversion to taking pills. The patient also expressed concern about potential diabetes, which has not been diagnosed but is a source of worry due to the implications of additional medication.   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 The patient acknowledged the need for lifestyle changes, including potential weight loss and increased physical activity, to manage her health concerns.   Rectal bleeding, history No longer active The patient is scheduled for a  consultation with a gastroenterologist due to a history of rectal bleeding, suspected to be due to hemorrhoids. The patient expressed apprehension about the invasiveness of a colonoscopy and is considering alternative screening methods such as Cologuard.   The patient's family history includes a mother with a history of stroke and possible hypertension. The patient expressed concern about the potential for osteoporosis, which is a known side effect of her thyroid medication and a condition that runs in her family. The patient is considering supplementing with calcium and vitamin D to mitigate this risk.     ***Trouble falling asleep    Specialists   All at [facility] Endocrinology - Dr Tedd Sias (Thyroid) Physiatry / Pain Management - Dr Mariah Milling (Back) OBGYN - Dr Dalbert Garnet   Working at [employer] - cooking at [employer] Physically active at work but not at exercise level Goal to improve exercise, goal to improve motivation   Health Maintenance:  Colon CA Screening - upcoming GI apt 12/04/23, considering Colonoscopy, w/ history of rectal bleeding     12/02/2023    2:17 PM 08/26/2023   10:21 AM  Depression screen PHQ 2/9  Decreased Interest 0 0  Down, Depressed, Hopeless 0 0  PHQ - 2 Score 0 0  Altered sleeping 1 1  Tired, decreased energy 1 1  Change in appetite 1 1  Feeling bad or failure about yourself  0 0  Trouble concentrating 0 0  Moving slowly or fidgety/restless  0 0  Suicidal thoughts 0 0  PHQ-9 Score 3 3  Difficult doing work/chores Not difficult at all Not difficult at all       12/02/2023    2:18 PM 08/26/2023   10:21 AM  GAD 7 : Generalized Anxiety Score  Nervous, Anxious, on Edge 0 0  Control/stop worrying 0 0  Worry too much - different things 0 0  Trouble relaxing 0 0  Restless 0 0  Easily annoyed or irritable 0 0  Afraid - awful might happen 0 0  Total GAD 7 Score 0 0     Past Medical History:  Diagnosis Date   Thyroid disease    Past Surgical History:   Procedure Laterality Date   CESAREAN SECTION     THYROIDECTOMY     Social History   Socioeconomic History   Marital status: Married    Spouse name: Not on file   Number of children: Not on file   Years of education: Not on file   Highest education level: Not on file  Occupational History   Not on file  Tobacco Use   Smoking status: Never   Smokeless tobacco: Never  Substance and Sexual Activity   Alcohol use: Never   Drug use: Not on file   Sexual activity: Not on file  Other Topics Concern   Not on file  Social History Narrative   Not on file   Social Drivers of Health   Financial Resource Strain: Not on file  Food Insecurity: Not on file  Transportation Needs: Not on file  Physical Activity: Not on file  Stress: Not on file  Social Connections: Not on file  Intimate Partner Violence: Not on file   Family History  Problem Relation Age of Onset   Breast cancer Neg Hx    Current Outpatient Medications on File Prior to Visit  Medication Sig   levothyroxine (SYNTHROID) 50 MCG tablet Take 50 mcg by mouth.   No current facility-administered medications on file prior to visit.    Review of Systems Per HPI unless specifically indicated above     Objective:    BP 124/82   Pulse 97   Ht 5\' 2"  (1.575 m)   Wt 230 lb (104.3 kg)   SpO2 97%   BMI 42.07 kg/m   Wt Readings from Last 3 Encounters:  12/02/23 230 lb (104.3 kg)  08/26/23 227 lb (103 kg)  12/22/19 230 lb (104.3 kg)    Physical Exam  IGP, Aptima HPV, rfx 16/18,45 - [facility]  Component Ref Range & Units 1 yr ago Comments  Diagnostic Interpretation Comment NEGATIVE FOR INTRAEPITHELIAL LESION OR MALIGNANCY.  Specimen adequacy: - [facility] Comment Satisfactory for evaluation. No endocervical component is identified.  Clinician provided ICD10: - [facility] Comment Z01.419  PERFORMED BY: - [facility] Comment [employer], Cytotechnologist (ASCP)  . - [facility] .   Note: - [facility] Comment The Pap smear is a  screening test designed to aid in the detection of premalignant and malignant conditions of the uterine cervix.  It is not a diagnostic procedure and should not be used as the sole means of detecting cervical cancer.  Both false-positive and false-negative reports do occur.  Test Method MT21 - [facility] Comment This liquid based ThinPrep(R) pap test was screened with the use of an image guided system.  HPV APTIMA - [facility] Negative Negative This nucleic acid amplification test detects fourteen high-risk HPV types (16,18,31,33,35,39,45,51,52,56,58,59,66,68) without differentiation.  HPV Genotype Reflex - [facility] Comment Criteria  not met, HPV Genotype not performed.  Resulting Agency [facility]   Narrative Performed by [facility] Performed at:  [address] [place] [address], [place], [place]  [id number] Lab Director: Jolene Schimke MD, Phone:  [phone] Performed at:  [address] [place] [address], [place], [place]  [id number] Lab Director: Jolene Schimke MD, Phone:  [phone] Specimen Comment: No. of containers.Marland Kitchen01 ThinPrep Vial Specimen Collected: 05/01/22 10:36   Performed by: Raechel Chute Last Resulted: 05/04/22 05:36  Received From: [facility]  Result Received: 08/26/23 10:19    Results for orders placed or performed in visit on 12/02/23  HM PAP SMEAR   Collection Time: 05/01/22 12:00 AM  Result Value Ref Range   HM Pap smear Negative, also negative HPV       Assessment & Plan:   Problem List Items Addressed This Visit   None Visit Diagnoses       Annual physical exam    -  Primary     Encounter for screening mammogram for malignant neoplasm of breast       Relevant Orders   MM 3D SCREENING MAMMOGRAM BILATERAL BREAST        Updated Health Maintenance information Reviewed recent lab results with patient Encouraged improvement to lifestyle with diet and exercise Goal of weight loss  Assessment and Plan               Orders Placed This Encounter  Procedures   MM 3D SCREENING MAMMOGRAM BILATERAL BREAST    Standing Status:   Future    Expiration Date:   12/01/2024    Reason for Exam (SYMPTOM  OR DIAGNOSIS REQUIRED):   Screening bilateral 3D Mammogram Tomo    Preferred imaging location?:   [facility]   HM PAP SMEAR    This external order was created through the Results Console.    No orders of the defined types were placed in this encounter.    Follow up plan: Return for 1 year fasting lab > 1 week later Annual Physical.  Saralyn Pilar, DO [facility] 12/02/2023, 2:30 PM

## 2023-12-02 NOTE — Patient Instructions (Addendum)
 Thank you for coming to the office today.  Start with the [facility] Plan - weight loss program, usually online / virtual health coach / nutritionist, says a 15 week program.  If needed  ------------------------------------------------------------------ WEIGHT MANAGEMENT  Dr Quillian Quince  [facility] [address] [place] [address] [place], [place] [address] Ph: [phone]  ----------------------------------  For Mammogram screening for breast cancer   Call the Imaging Center below anytime to schedule your own appointment now that order has been placed.  [facility] at [facility] [address], [address] [place], [place] [address] Phone: [phone]   FUTURE CONSIDERATION Coronary Calcium Score Cardiac CT Scan. This is a screening test for patients aged 79-50+ with cardiovascular risk factors or who are healthy but would be interested in Cardiovascular Screening for heart disease. Even if there is a family history of heart disease, this imaging can be useful. Typically it can be done every 5+ years or at a different timeline we agree on  The scan will look at the chest and mainly focus on the heart and identify early signs of calcium build up or blockages within the heart arteries. It is not 100% accurate for identifying blockages or heart disease, but it is useful to help [place] predict who may have some early changes or be at risk in the future for a heart attack or cardiovascular problem.  The results are reviewed by a Cardiologist and they will document the results. It should become available on MyChart. Typically the results are divided into percentiles based on other patients of the same demographic and age. So it will compare your risk to others similar to you. If you have a higher score >99 or higher percentile >75%tile, it is recommended to consider Statin cholesterol therapy  and or referral to Cardiologist. I will try to help explain your results and if we have questions we can contact the Cardiologist.  You will be contacted for scheduling. Usually it is done at any imaging facility through [facility], [facility] or [facility].  The cost is $99 flat fee total and it does not go through insurance, so no authorization is required.   --------------------------------  - Melatonin 5mg  for sleep  Sleep Hygiene Recommendations to promote healthy sleep in all patients, especially if symptoms of insomnia are worsening. Due to the nature of sleep rhythms, if your body gets "out of rhythm", it may take some time before your sleep cycle can be "reset".  Please try to follow as many of the following tips as you can, usually there are only a few of these are the primary cause of the problem.  ?To reset your sleep rhythm, go to bed and get up at the same time every day ?Sleep only long enough to feel rested and then get out of bed ?Do not try to force yourself to sleep. If you can't sleep, get out of bed and try again later. ?Avoid naps during the day, unless excessively tired. The more sleeping during the day, then the less sleep your body needs at night.  ?Have coffee, tea, and other foods that have caffeine only in the morning ?Exercise several days a week, but not right before bed ?If you drink alcohol, prefer to have appropriate drink with one meal, but prefer to avoid alcohol in the evening, and bedtime ?If you smoke, avoid smoking, especially in the evening  ?Avoid watching  TV or looking at phones, computers, or reading devices ("e-books") that give off light at least 30 minutes before bed. This artificial light sends "awake signals" to your brain and can make it harder to fall asleep. ?Make your bedroom a comfortable place where it is easy to fall asleep: Put up shades or special blackout curtains to block light from outside. Use a  white noise machine to block noise. Keep the temperature cool. ?Try your best to solve or at least address your problems before you go to bed ?Use relaxation techniques to manage stress. Ask your health care provider to suggest some techniques that may work well for you. These may include: Breathing exercises. Routines to release muscle tension. Visualizing peaceful scenes.  DUE for FASTING BLOOD WORK (no food or drink after midnight before the lab appointment, only water or coffee without cream/sugar on the morning of)  SCHEDULE "Lab Only" visit in the morning at the clinic for lab draw in 1 YEAR  - Make sure Lab Only appointment is at about 1 week before your next appointment, so that results will be available  For Lab Results, once available within 2-3 days of blood draw, you can can log in to MyChart online to view your results and a brief explanation. Also, we can discuss results at next follow-up visit.   Please schedule a Follow-up Appointment to: Return for 1 year fasting lab > 1 week later Annual Physical.  If you have any other questions or concerns, please feel free to call the office or send a message through MyChart. You may also schedule an earlier appointment if necessary.  Additionally, you may be receiving a survey about your experience at our office within a few days to 1 week by e-mail or mail. We value your feedback.  Saralyn Pilar, DO [facility], [place]

## 2023-12-03 ENCOUNTER — Other Ambulatory Visit: Payer: Self-pay | Admitting: Family Medicine

## 2023-12-03 DIAGNOSIS — E042 Nontoxic multinodular goiter: Secondary | ICD-10-CM

## 2023-12-03 DIAGNOSIS — Z0000 Encounter for general adult medical examination without abnormal findings: Secondary | ICD-10-CM

## 2023-12-03 DIAGNOSIS — R030 Elevated blood-pressure reading, without diagnosis of hypertension: Secondary | ICD-10-CM

## 2023-12-03 DIAGNOSIS — E039 Hypothyroidism, unspecified: Secondary | ICD-10-CM

## 2023-12-03 DIAGNOSIS — R7309 Other abnormal glucose: Secondary | ICD-10-CM

## 2023-12-17 ENCOUNTER — Ambulatory Visit
Admission: RE | Admit: 2023-12-17 | Discharge: 2023-12-17 | Disposition: A | Discharge status: Home / Self Care | Source: Ambulatory Visit | Attending: Family Medicine | Admitting: Family Medicine

## 2023-12-17 DIAGNOSIS — Z1231 Encounter for screening mammogram for malignant neoplasm of breast: Secondary | ICD-10-CM | POA: Diagnosis present

## 2024-01-17 ENCOUNTER — Ambulatory Visit (INDEPENDENT_AMBULATORY_CARE_PROVIDER_SITE_OTHER): Admitting: Family Medicine

## 2024-01-17 ENCOUNTER — Encounter: Payer: Self-pay | Admitting: Family Medicine

## 2024-01-17 VITALS — BP 122/80 | HR 85 | Ht 62.0 in | Wt 227.5 lb

## 2024-01-17 DIAGNOSIS — R051 Acute cough: Secondary | ICD-10-CM | POA: Diagnosis not present

## 2024-01-17 DIAGNOSIS — J0110 Acute frontal sinusitis, unspecified: Secondary | ICD-10-CM

## 2024-01-17 DIAGNOSIS — B353 Tinea pedis: Secondary | ICD-10-CM

## 2024-01-17 DIAGNOSIS — H1031 Unspecified acute conjunctivitis, right eye: Secondary | ICD-10-CM

## 2024-01-17 DIAGNOSIS — B351 Tinea unguium: Secondary | ICD-10-CM

## 2024-01-17 MED ORDER — POLYMYXIN B-TRIMETHOPRIM 10000-0.1 UNIT/ML-% OP SOLN: 1.0000 [drp] | Freq: Four times a day (QID) | OPHTHALMIC | 0 refills | Status: AC

## 2024-01-17 MED ORDER — FLUTICASONE PROPIONATE 50 MCG/ACT NA SUSP: 2.0000 | Freq: Every day | NASAL | 0 refills | Status: AC

## 2024-01-17 MED ORDER — AZITHROMYCIN 250 MG PO TABS: ORAL_TABLET | ORAL | 0 refills | Status: AC

## 2024-01-17 NOTE — Patient Instructions (Addendum)
 Thank you for coming to the office today.  1. It sounds like you have a Sinusitis (Bacterial Infection) - this most likely started as an Upper Respiratory Virus that has settled into an infection. Allergies can also cause this.  Start Azithromycin Z pak (antibiotic) 2 tabs day 1, then 1 tab x 4 days, complete entire course even if improved  Start nasal steroid Flonase 2 sprays in each nostril daily for 4-6 weeks, may repeat course seasonally or as needed  - Improve hydration by drinking plenty of clear fluids (water, gatorade) to reduce secretions and thin congestion - Congestion draining down throat can cause irritation. May try warm herbal tea with honey, cough drops - Can take Tylenol  or Ibuprofen as needed for fevers - May continue over the counter cold medicine as you are, I would not use any decongestant or mucinex longer than 7 days.  ----  Also covered for R eye Pink Eye, can be conjunctivitis from allergy bacteria or viral  Use the antibiotic eye drop Polytrim 4 times per day for 7-10 days  Warm compresses as well  Please schedule a Follow-up Appointment to: Return if symptoms worsen or fail to improve.  If you have any other questions or concerns, please feel free to call the office or send a message through MyChart. You may also schedule an earlier appointment if necessary.  Additionally, you may be receiving a survey about your experience at our office within a few days to 1 week by e-mail or mail. We value your feedback.  Domingo Friend, DO [facility], [place]

## 2024-01-17 NOTE — Progress Notes (Signed)
 Subjective:    Patient ID: Alison Stein, female    DOB: [birth year], 48 y.o.   MRN: [id number]  Alison Stein is a 48 y.o. female presenting on 01/17/2024 for Sinusitis and Cough  Patient presents for a same day appointment.  HPI  Discussed the use of AI scribe software for clinical note transcription with the patient, who gave verbal consent to proceed.  History of Present Illness   Alison Stein is a 48 year old female who presents with persistent upper respiratory symptoms and conjunctivitis.  She has been experiencing cough and congestion for approximately three weeks, which began during her spring break. Initially, she managed the symptoms with Mucinex, which helped break up mucus, but the symptoms persisted. The onset coincided with heavy pollen exposure, which she usually tolerates well, but this time it affected her more severely. Her symptoms seemed to improve slightly before worsening again, leading to her missing work on Thursday and Friday. The cough was initially dry but became productive with mucus. She reports sinus pressure affecting both her upper sinuses and chest. No fever, chills, digestive symptoms, or nausea, although the mucus makes her feel like she might vomit.  She developed conjunctivitis in her right eye, described as red, irritated, and with drainage. She used leftover Vigamox eye drops from her children for one day. No recent fever or chills and no digestive symptoms.  Her husband has also been experiencing respiratory issues and was diagnosed with a double ear infection and a chest and respiratory infection. She mentions that her ear felt like it was going to start hurting but the sensation subsided.  She has not been eating much due to a lack of smell and taste, which she attributes to her sinus issues. She has a history of avoiding medications but is willing to take them to improve her condition.  She reports a long-standing issue with toenail fungus,  which she has been managing with over-the-counter treatments without success. The toenail is thickened and discolored, and she has been using a product she found online without improvement.         01/17/2024    9:04 AM 12/02/2023    2:17 PM 08/26/2023   10:21 AM  Depression screen PHQ 2/9  Decreased Interest 0 0 0  Down, Depressed, Hopeless 0 0 0  PHQ - 2 Score 0 0 0  Altered sleeping 2 1 1   Tired, decreased energy 1 1 1   Change in appetite 2 1 1   Feeling bad or failure about yourself  0 0 0  Trouble concentrating 0 0 0  Moving slowly or fidgety/restless 0 0 0  Suicidal thoughts 0 0 0  PHQ-9 Score 5 3 3   Difficult doing work/chores Somewhat difficult Not difficult at all Not difficult at all       01/17/2024    9:04 AM 12/02/2023    2:18 PM 08/26/2023   10:21 AM  GAD 7 : Generalized Anxiety Score  Nervous, Anxious, on Edge 0 0 0  Control/stop worrying 0 0 0  Worry too much - different things 1 0 0  Trouble relaxing 0 0 0  Restless 0 0 0  Easily annoyed or irritable 0 0 0  Afraid - awful might happen 0 0 0  Total GAD 7 Score 1 0 0  Anxiety Difficulty Not difficult at all      Social History   Tobacco Use   Smoking status: Never   Smokeless tobacco: Never  Substance Use  Topics   Alcohol use: Never    Review of Systems Per HPI unless specifically indicated above     Objective:    BP 122/80 (BP Location: Left Arm, Patient Position: Sitting, Cuff Size: Large)   Pulse 85   Ht 5\' 2"  (1.575 m)   Wt 227 lb 8 oz (103.2 kg)   LMP  (LMP Unknown)   SpO2 97%   BMI 41.61 kg/m   Wt Readings from Last 3 Encounters:  01/17/24 227 lb 8 oz (103.2 kg)  12/02/23 230 lb (104.3 kg)  08/26/23 227 lb (103 kg)    Physical Exam Vitals and nursing note reviewed.  Constitutional:      General: She is not in acute distress.    Appearance: Normal appearance. She is well-developed. She is not diaphoretic.     Comments: Well-appearing, comfortable, cooperative  HENT:     Head:  Normocephalic and atraumatic.     Right Ear: Ear canal and external ear normal. There is no impacted cerumen.     Left Ear: Ear canal and external ear normal. There is no impacted cerumen.     Ears:     Comments: Effusion behind both TMs, no erythema or purulence. Eyes:     General:        Right eye: No discharge.        Left eye: No discharge.     Conjunctiva/sclera: Conjunctivae normal.  Cardiovascular:     Rate and Rhythm: Normal rate.  Pulmonary:     Effort: Pulmonary effort is normal.  Skin:    General: Skin is warm and dry.     Findings: Lesion (Left foot great toenail onychomycosis thickening discoloration, also 4th toe) and rash (mild non specific rash appearance on side of foot.) present. No erythema.  Neurological:     Mental Status: She is alert and oriented to person, place, and time.  Psychiatric:        Mood and Affect: Mood normal.        Behavior: Behavior normal.        Thought Content: Thought content normal.     Comments: Well groomed, good eye contact, normal speech and thoughts     Results for orders placed or performed in visit on 12/02/23  HM PAP SMEAR   Collection Time: 05/01/22 12:00 AM  Result Value Ref Range   HM Pap smear Negative, also negative HPV       Assessment & Plan:   Problem List Items Addressed This Visit   None Visit Diagnoses       Acute non-recurrent frontal sinusitis    -  Primary   Relevant Medications   azithromycin (ZITHROMAX Z-PAK) 250 MG tablet   fluticasone (FLONASE) 50 MCG/ACT nasal spray     Acute conjunctivitis of right eye, unspecified acute conjunctivitis type       Relevant Medications   trimethoprim-polymyxin b (POLYTRIM) ophthalmic solution     Acute cough         Onychomycosis of left great toe       Relevant Medications   azithromycin (ZITHROMAX Z-PAK) 250 MG tablet     Tinea pedis of left foot       Relevant Medications   azithromycin (ZITHROMAX Z-PAK) 250 MG tablet        Sinusitis Sinusitis with  congestion, cough, and sinus pressure for three weeks. Possible bacterial infection indicated by symptoms and duration. Loss of smell and taste noted. - Prescribed azithromycin Z-Pak for five days. -  Recommended Flonase nasal spray. Rx sent  Conjunctivitis Right eye conjunctivitis with redness, irritation, and drainage. Unilateral presentation suggests bacterial origin. Polytrim preferred due to cost and resistance. Note she used 1 day of old antibiotic eye drops Moxifloxacin  - Prescribed Polytrim eye drops, one drop in the affected eye four times daily for 7-10 days. - Advised warm compresses for 10-15 minutes in the morning.  Fungal infection of toenail Tinea Pedis, possible Chronic onychomycosis with thickening and discoloration. Prefers topical treatment over oral medications. - Recommended over-the-counter antifungal nail polish for daily application for 12 months. - if not resolving can consider oral Terbinafine course. 3 months - OTC anti fungal cream for foot as well for rash      No orders of the defined types were placed in this encounter.   Meds ordered this encounter  Medications   azithromycin (ZITHROMAX Z-PAK) 250 MG tablet    Sig: Take 2 tabs (500mg  total) on Day 1. Take 1 tab (250mg ) daily for next 4 days.    Dispense:  6 tablet    Refill:  0   trimethoprim-polymyxin b (POLYTRIM) ophthalmic solution    Sig: Place 1 drop into the right eye 4 (four) times daily. For 7-10 days    Dispense:  10 mL    Refill:  0   fluticasone (FLONASE) 50 MCG/ACT nasal spray    Sig: Place 2 sprays into both nostrils daily. Use for 4-6 weeks then stop and use seasonally or as needed.    Dispense:  16 g    Refill:  0    Follow up plan: Return if symptoms worsen or fail to improve.   Domingo Friend, DO [facility] 01/17/2024, 8:42 AM

## 2024-01-20 ENCOUNTER — Encounter: Payer: Self-pay | Admitting: Family Medicine

## 2024-01-20 DIAGNOSIS — B351 Tinea unguium: Secondary | ICD-10-CM

## 2024-01-20 MED ORDER — TERBINAFINE HCL 250 MG PO TABS: 250.0000 mg | ORAL_TABLET | Freq: Every day | ORAL | 2 refills | Status: AC

## 2024-02-04 ENCOUNTER — Ambulatory Visit: Admitting: Family Medicine

## 2024-02-04 ENCOUNTER — Encounter: Payer: Self-pay | Admitting: Family Medicine

## 2024-02-04 VITALS — BP 124/80 | HR 90 | Temp 98.5°F | Ht 62.0 in | Wt 227.4 lb

## 2024-02-04 DIAGNOSIS — J029 Acute pharyngitis, unspecified: Secondary | ICD-10-CM

## 2024-02-04 NOTE — Patient Instructions (Addendum)
 Thank you for coming to the office today.  It sounds most like a Viral Pharyngitis (Sore Throat) caused by a Virus   - I do not see any evidence of Strep Throat today. No ear infection.  - For Sore throat, take Ibuprofen 400-600mg  every 6-8 hours (3 times a day) with food - Drink extra clear fluids (water, or G2 gatorade), try colder soft foods if needed otherwise regular diet - Drink warm herbal tea with honey to help reduce sore throat swelling - You can also try to cover for potential allergy symptoms that often make sore throat worse due to post-nasal drainage     - Try over the counter Nasal Saline spray (Simply Saline, Ocean Spray) as needed to reduce congestion     - Try Loratadine (Claritin) 10mg  daily for up to 4 weeks     - Flonase  steroid nasal spray (OTC) 2 sprays in each nostril daily for 4 weeks   If still not improving by next week, we can consider an alternative antibiotic if we need    Please schedule a Follow-up Appointment to: Return if symptoms worsen or fail to improve.  If you have any other questions or concerns, please feel free to call the office or send a message through MyChart. You may also schedule an earlier appointment if necessary.  Additionally, you may be receiving a survey about your experience at our office within a few days to 1 week by e-mail or mail. We value your feedback.  Domingo Friend, DO [facility], [place]

## 2024-02-04 NOTE — Progress Notes (Signed)
 Subjective:    Patient ID: Alison Stein, female    DOB: 1976/03/31, 48 y.o.   MRN: [id number]  Alison Stein is a 48 y.o. female presenting on 02/04/2024 for Sore Throat  Patient presents for a same day appointment.  HPI  Discussed the use of AI scribe software for clinical note transcription with the patient, who gave verbal consent to proceed.  History of Present Illness   Alison Stein is a 48 year old female who presents with a sore throat.  She has experienced a sore throat for less than a week, with pain localized to the left side. The pain is tender and severe when swallowing, disrupting her sleep. No fever, cough, or congestion, but there is phlegm in the throat.  Her husband is experiencing similar symptoms, suggesting a possible infectious cause. She is currently taking terbinafine  (Lamisil ) for a toe fungus. She denies any ear pain or pressure, although swallowing causes discomfort that radiates towards the ear on the affected side.          02/04/2024    1:51 PM 01/17/2024    9:04 AM 12/02/2023    2:17 PM  Depression screen PHQ 2/9  Decreased Interest 0 0 0  Down, Depressed, Hopeless 0 0 0  PHQ - 2 Score 0 0 0  Altered sleeping 1 2 1   Tired, decreased energy 1 1 1   Change in appetite 1 2 1   Feeling bad or failure about yourself  0 0 0  Trouble concentrating 0 0 0  Moving slowly or fidgety/restless 0 0 0  Suicidal thoughts 0 0 0  PHQ-9 Score 3 5 3   Difficult doing work/chores  Somewhat difficult Not difficult at all       02/04/2024    1:51 PM 01/17/2024    9:04 AM 12/02/2023    2:18 PM 08/26/2023   10:21 AM  GAD 7 : Generalized Anxiety Score  Nervous, Anxious, on Edge 0 0 0 0  Control/stop worrying 0 0 0 0  Worry too much - different things 0 1 0 0  Trouble relaxing 0 0 0 0  Restless 0 0 0 0  Easily annoyed or irritable 0 0 0 0  Afraid - awful might happen 0 0 0 0  Total GAD 7 Score 0 1 0 0  Anxiety Difficulty  Not difficult at all      Social  History   Tobacco Use   Smoking status: Never   Smokeless tobacco: Never  Substance Use Topics   Alcohol use: Never    Review of Systems Per HPI unless specifically indicated above     Objective:     BP 124/80 (BP Location: Right Arm, Patient Position: Sitting, Cuff Size: Large)   Pulse 90   Temp 98.5 F (36.9 C) (Oral)   Ht 5\' 2"  (1.575 m)   Wt 227 lb 6 oz (103.1 kg)   LMP  (LMP Unknown)   SpO2 98%   BMI 41.59 kg/m   Wt Readings from Last 3 Encounters:  02/04/24 227 lb 6 oz (103.1 kg)  01/17/24 227 lb 8 oz (103.2 kg)  12/02/23 230 lb (104.3 kg)    Physical Exam Vitals and nursing note reviewed.  Constitutional:      General: She is not in acute distress.    Appearance: She is well-developed. She is not diaphoretic.     Comments: Well-appearing, comfortable, cooperative  HENT:     Head: Normocephalic and atraumatic.  Right Ear: Tympanic membrane, ear canal and external ear normal. There is no impacted cerumen.     Left Ear: Tympanic membrane, ear canal and external ear normal. There is no impacted cerumen.     Mouth/Throat:     Mouth: Mucous membranes are moist.     Pharynx: No oropharyngeal exudate or posterior oropharyngeal erythema.     Comments: Left side with some tonsillar mild erythema without focal asymmetry. No purulence. Eyes:     General:        Right eye: No discharge.        Left eye: No discharge.     Conjunctiva/sclera: Conjunctivae normal.  Neck:     Thyroid: No thyromegaly.  Cardiovascular:     Rate and Rhythm: Normal rate and regular rhythm.     Heart sounds: Normal heart sounds. No murmur heard. Pulmonary:     Effort: Pulmonary effort is normal. No respiratory distress.     Breath sounds: Normal breath sounds. No wheezing or rales.  Musculoskeletal:        General: Normal range of motion.     Cervical back: Normal range of motion and neck supple. Tenderness (left anterior neck without lymphedenopathy localized) present.   Lymphadenopathy:     Cervical: No cervical adenopathy.  Skin:    General: Skin is warm and dry.     Findings: No erythema or rash.  Neurological:     Mental Status: She is alert and oriented to person, place, and time.  Psychiatric:        Behavior: Behavior normal.     Comments: Well groomed, good eye contact, normal speech and thoughts     Results for orders placed or performed in visit on 12/02/23  HM PAP SMEAR   Collection Time: 05/01/22 12:00 AM  Result Value Ref Range   HM Pap smear Negative, also negative HPV       Assessment & Plan:   Problem List Items Addressed This Visit   None Visit Diagnoses       Acute pharyngitis, unspecified etiology    -  Primary        Acute pharyngitis Inflammation and irritation in the throat, likely viral etiology. Exam not consistent with strep. Afebrile. Possible viral pharyngitis infectious cause due to husband's similar symptoms. Referred ear pain due to pharyngitis.  - Ibuprofen 400-800 mg every 6-8 hours with meals for pain and inflammation. - Warm salt water gargles and warm herbal tea with honey for symptomatic relief. - Consider Claritin or Zyrtec if postnasal drip is suspected. - If symptoms persist beyond one week, consider antibiotic treatment.        No orders of the defined types were placed in this encounter.   No orders of the defined types were placed in this encounter.   Follow up plan: Return if symptoms worsen or fail to improve.  Domingo Friend, DO [facility] 02/04/2024, 2:12 PM

## 2024-02-06 ENCOUNTER — Encounter: Payer: Self-pay | Admitting: *Deleted

## 2024-03-02 ENCOUNTER — Encounter
Admission: RE | Disposition: A | Discharge status: Home / Self Care | Payer: Self-pay | Source: Home / Self Care | Attending: Gastroenterology

## 2024-03-02 ENCOUNTER — Other Ambulatory Visit: Payer: Self-pay

## 2024-03-02 ENCOUNTER — Ambulatory Visit: Admitting: Certified Registered Nurse Anesthetist

## 2024-03-02 ENCOUNTER — Ambulatory Visit
Admission: RE | Admit: 2024-03-02 | Discharge: 2024-03-02 | Disposition: A | Discharge status: Home / Self Care | Attending: Gastroenterology | Admitting: Gastroenterology

## 2024-03-02 DIAGNOSIS — E669 Obesity, unspecified: Secondary | ICD-10-CM | POA: Diagnosis not present

## 2024-03-02 DIAGNOSIS — Z1211 Encounter for screening for malignant neoplasm of colon: Secondary | ICD-10-CM | POA: Insufficient documentation

## 2024-03-02 DIAGNOSIS — K640 First degree hemorrhoids: Secondary | ICD-10-CM | POA: Insufficient documentation

## 2024-03-02 DIAGNOSIS — D125 Benign neoplasm of sigmoid colon: Secondary | ICD-10-CM | POA: Insufficient documentation

## 2024-03-02 DIAGNOSIS — K621 Rectal polyp: Secondary | ICD-10-CM | POA: Diagnosis not present

## 2024-03-02 DIAGNOSIS — K635 Polyp of colon: Secondary | ICD-10-CM | POA: Insufficient documentation

## 2024-03-02 DIAGNOSIS — Z6841 Body Mass Index (BMI) 40.0 and over, adult: Secondary | ICD-10-CM | POA: Diagnosis not present

## 2024-03-02 DIAGNOSIS — E039 Hypothyroidism, unspecified: Secondary | ICD-10-CM | POA: Diagnosis not present

## 2024-03-02 DIAGNOSIS — E66813 Obesity, class 3: Secondary | ICD-10-CM | POA: Diagnosis not present

## 2024-03-02 HISTORY — PX: COLONOSCOPY: SHX5424

## 2024-03-02 HISTORY — DX: Abnormal uterine and vaginal bleeding, unspecified: N93.9

## 2024-03-02 HISTORY — PX: POLYPECTOMY: SHX149

## 2024-03-02 SURGERY — COLONOSCOPY
Anesthesia: General

## 2024-03-02 MED ORDER — PROPOFOL 10 MG/ML IV BOLUS: INTRAVENOUS | Status: DC | PRN

## 2024-03-02 MED ORDER — LIDOCAINE HCL (CARDIAC) PF 100 MG/5ML IV SOSY: PREFILLED_SYRINGE | INTRAVENOUS | Status: DC | PRN

## 2024-03-02 MED ORDER — DEXMEDETOMIDINE HCL IN NACL 80 MCG/20ML IV SOLN: INTRAVENOUS | Status: DC | PRN

## 2024-03-02 MED ORDER — SODIUM CHLORIDE 0.9 % IV SOLN: INTRAVENOUS | Status: DC

## 2024-03-02 NOTE — Anesthesia Postprocedure Evaluation (Signed)
 Anesthesia Post Note  Patient: Alison Stein  Procedure(s) Performed: COLONOSCOPY POLYPECTOMY, INTESTINE  Patient location during evaluation: Endoscopy Anesthesia Type: General Level of consciousness: awake and alert Pain management: pain level controlled Vital Signs Assessment: post-procedure vital signs reviewed and stable Respiratory status: spontaneous breathing, nonlabored ventilation, respiratory function stable and patient connected to nasal cannula oxygen Cardiovascular status: blood pressure returned to baseline and stable Postop Assessment: no apparent nausea or vomiting Anesthetic complications: no   No notable events documented.   Last Vitals:  Vitals:   03/02/24 1123 03/02/24 1130  BP: 110/72   Pulse: 72 71  Resp: 20 18  Temp:    SpO2: 95% 98%    Last Pain:  Vitals:   03/02/24 1123  PainSc: 0-No pain                 Vanice Genre

## 2024-03-02 NOTE — Anesthesia Procedure Notes (Signed)
 Date/Time: 03/02/2024 10:41 AM  Performed by: Angelia Kelp, CRNAPre-anesthesia Checklist: Patient identified, Emergency Drugs available, Suction available, Patient being monitored and Timeout performed Patient Re-evaluated:Patient Re-evaluated prior to induction Oxygen Delivery Method: Nasal cannula Preoxygenation: Pre-oxygenation with 100% oxygen Induction Type: IV induction

## 2024-03-02 NOTE — H&P (Signed)
 Outpatient short stay form Pre-procedure 03/02/2024  Shane Darling, MD  Primary Physician: Raina Bunting, DO  Reason for visit:  Screening  History of present illness:    48 y/o ladyw ith history of hypothyroidism and obesity here for index screening colonoscopy. No blood thinners. No family history of colon cancer. History of two c-sections.    Current Facility-Administered Medications:    0.9 %  sodium chloride  infusion, , Intravenous, Continuous, Kaleisha Bhargava, Leanora Prophet, MD, Last Rate: 20 mL/hr at 03/02/24 1031, Continued from Pre-op at 03/02/24 1031  Medications Prior to Admission  Medication Sig Dispense Refill Last Dose/Taking   fluticasone  (FLONASE ) 50 MCG/ACT nasal spray Place 2 sprays into both nostrils daily. Use for 4-6 weeks then stop and use seasonally or as needed. 16 g 0 03/01/2024   levothyroxine (SYNTHROID) 50 MCG tablet Take 50 mcg by mouth.   03/01/2024   terbinafine  (LAMISIL ) 250 MG tablet Take 1 tablet (250 mg total) by mouth daily. For up to 3 months 30 tablet 2 03/01/2024   azithromycin  (ZITHROMAX  Z-PAK) 250 MG tablet Take 2 tabs (500mg  total) on Day 1. Take 1 tab (250mg ) daily for next 4 days. (Patient not taking: Reported on 02/04/2024) 6 tablet 0    trimethoprim -polymyxin b  (POLYTRIM ) ophthalmic solution Place 1 drop into the right eye 4 (four) times daily. For 7-10 days (Patient not taking: Reported on 02/04/2024) 10 mL 0      Allergies  Allergen Reactions   Penicillins Rash     Past Medical History:  Diagnosis Date   Abnormal uterine bleeding    Thyroid disease     Review of systems:  Otherwise negative.    Physical Exam  Gen: Alert, oriented. Appears stated age.  HEENT: PERRLA. Lungs: No respiratory distress CV: RRR Abd: soft, benign, no masses Ext: No edema    Planned procedures: Proceed with colonoscopy. The patient understands the nature of the planned procedure, indications, risks, alternatives and potential complications  including but not limited to bleeding, infection, perforation, damage to internal organs and possible oversedation/side effects from anesthesia. The patient agrees and gives consent to proceed.  Please refer to procedure notes for findings, recommendations and patient disposition/instructions.     Shane Darling, MD [facility]

## 2024-03-02 NOTE — Op Note (Signed)
 [facility] Patient Name: Alison Stein Procedure Date: 03/02/2024 10:29 AM MRN: [id number] Account #: [id number] Date of Birth: 03-14-1976 Admit Type: Outpatient Age: 48 Room: [facility] ENDO ROOM 3 Gender: Female Note Status: Finalized Instrument Name: Charlyn Cooley [id number] Procedure:             Colonoscopy Indications:           Screening for colorectal malignant neoplasm Providers:             Leida Puna MD, MD Medicines:             Monitored Anesthesia Care Complications:         No immediate complications. Estimated blood loss:                         Minimal. Procedure:             Pre-Anesthesia Assessment:                        - Prior to the procedure, a History and Physical was                         performed, and patient medications and allergies were                         reviewed. The patient is competent. The risks and                         benefits of the procedure and the sedation options and                         risks were discussed with the patient. All questions                         were answered and informed consent was obtained.                         Patient identification and proposed procedure were                         verified by the physician, the nurse, the                         anesthesiologist, the anesthetist and the technician                         in the endoscopy suite. Mental Status Examination:                         alert and oriented. Airway Examination: normal                         oropharyngeal airway and neck mobility. Respiratory                         Examination: clear to auscultation. CV Examination:                         normal. Prophylactic Antibiotics: The patient does not  require prophylactic antibiotics. Prior                         Anticoagulants: The patient has taken no anticoagulant                         or antiplatelet agents. ASA Grade  Assessment: III - A                         patient with severe systemic disease. After reviewing                         the risks and benefits, the patient was deemed in                         satisfactory condition to undergo the procedure. The                         anesthesia plan was to use monitored anesthesia care                         (MAC). Immediately prior to administration of                         medications, the patient was re-assessed for adequacy                         to receive sedatives. The heart rate, respiratory                         rate, oxygen saturations, blood pressure, adequacy of                         pulmonary ventilation, and response to care were                         monitored throughout the procedure. The physical                         status of the patient was re-assessed after the                         procedure.                        After obtaining informed consent, the colonoscope was                         passed under direct vision. Throughout the procedure,                         the patient's blood pressure, pulse, and oxygen                         saturations were monitored continuously. The                         Colonoscope was introduced through the anus and  advanced to the the terminal ileum, with                         identification of the appendiceal orifice and IC                         valve. The colonoscopy was performed without                         difficulty. The patient tolerated the procedure well.                         The quality of the bowel preparation was good. The                         terminal ileum, ileocecal valve, appendiceal orifice,                         and rectum were photographed. Findings:      The perianal and digital rectal examinations were normal.      The terminal ileum appeared normal.      A 2 mm polyp was found in the cecum. The polyp was sessile. The  polyp       was removed with a cold snare. Resection and retrieval were complete.       Estimated blood loss was minimal.      Two sessile polyps were found in the sigmoid colon. The polyps were 3 to       5 mm in size. These polyps were removed with a cold snare. Resection and       retrieval were complete. Estimated blood loss was minimal.      Two sessile polyps were found in the rectum. The polyps were 2 to 4 mm       in size. These polyps were removed with a cold snare. Resection and       retrieval were complete. Estimated blood loss was minimal.      Internal hemorrhoids were found during retroflexion. The hemorrhoids       were Grade I (internal hemorrhoids that do not prolapse).      The exam was otherwise without abnormality on direct and retroflexion       views. Impression:            - The examined portion of the ileum was normal.                        - One 2 mm polyp in the cecum, removed with a cold                         snare. Resected and retrieved.                        - Two 3 to 5 mm polyps in the sigmoid colon, removed                         with a cold snare. Resected and retrieved.                        - Two 2 to 4 mm polyps in the  rectum, removed with a                         cold snare. Resected and retrieved.                        - Internal hemorrhoids.                        - The examination was otherwise normal on direct and                         retroflexion views. Recommendation:        - Discharge patient to home.                        - Resume previous diet.                        - Continue present medications.                        - Await pathology results.                        - Repeat colonoscopy for surveillance based on                         pathology results.                        - Return to referring physician as previously                         scheduled. Procedure Code(s):     --- Professional ---                         [phone], Colonoscopy, flexible; with removal of                         tumor(s), polyp(s), or other lesion(s) by snare                         technique Diagnosis Code(s):     --- Professional ---                        Z12.11, Encounter for screening for malignant neoplasm                         of colon                        K64.0, First degree hemorrhoids                        D12.0, Benign neoplasm of cecum                        D12.5, Benign neoplasm of sigmoid colon                        D12.8, Benign neoplasm of rectum CPT copyright 2022 American Medical Association.  All rights reserved. The codes documented in this report are preliminary and upon coder review may  be revised to meet current compliance requirements. Leida Puna MD, MD 03/02/2024 11:13:13 AM Number of Addenda: 0 Note Initiated On: 03/02/2024 10:29 AM Scope Withdrawal Time: 0 hours 15 minutes 28 seconds  Total Procedure Duration: 0 hours 20 minutes 36 seconds  Estimated Blood Loss:  Estimated blood loss was minimal.      [facility]

## 2024-03-02 NOTE — Anesthesia Preprocedure Evaluation (Signed)
 Anesthesia Evaluation  Patient identified by MRN, date of birth, ID band Patient awake    Reviewed: Allergy & Precautions, H&P , NPO status , Patient's Chart, lab work & pertinent test results, reviewed documented beta blocker date and time   History of Anesthesia Complications Negative for: history of anesthetic complications  Airway Mallampati: III  TM Distance: >3 FB Neck ROM: full    Dental  (+) Dental Advidsory Given, Teeth Intact   Pulmonary neg pulmonary ROS   Pulmonary exam normal breath sounds clear to auscultation       Cardiovascular Exercise Tolerance: Good negative cardio ROS Normal cardiovascular exam Rhythm:regular Rate:Normal     Neuro/Psych negative neurological ROS  negative psych ROS   GI/Hepatic negative GI ROS, Neg liver ROS,,,  Endo/Other  neg diabetesHypothyroidism  Class 3 obesity  Renal/GU negative Renal ROS  negative genitourinary   Musculoskeletal   Abdominal   Peds  Hematology negative hematology ROS (+)   Anesthesia Other Findings Past Medical History: No date: Abnormal uterine bleeding No date: Thyroid disease   Reproductive/Obstetrics negative OB ROS                             Anesthesia Physical Anesthesia Plan  ASA: 3  Anesthesia Plan: General   Post-op Pain Management:    Induction: Intravenous  PONV Risk Score and Plan: 3 and Propofol infusion, TIVA and Treatment may vary due to age or medical condition  Airway Management Planned: Natural Airway and Nasal Cannula  Additional Equipment:   Intra-op Plan:   Post-operative Plan:   Informed Consent: I have reviewed the patients History and Physical, chart, labs and discussed the procedure including the risks, benefits and alternatives for the proposed anesthesia with the patient or authorized representative who has indicated his/her understanding and acceptance.     Dental Advisory  Given  Plan Discussed with: Anesthesiologist, CRNA and Surgeon  Anesthesia Plan Comments:         Anesthesia Quick Evaluation

## 2024-03-02 NOTE — Transfer of Care (Signed)
 Immediate Anesthesia Transfer of Care Note  Patient: Alison Stein  Procedure(s) Performed: COLONOSCOPY POLYPECTOMY, INTESTINE  Patient Location: Endoscopy Unit  Anesthesia Type:General  Level of Consciousness: sedated and drowsy  Airway & Oxygen Therapy: Patient Spontanous Breathing  Post-op Assessment: Report given to RN and Post -op Vital signs reviewed and stable  Post vital signs: Reviewed and stable  Last Vitals:  Vitals Value Taken Time  BP 99/54 03/02/24 11:11  Temp    Pulse 73 03/02/24 11:12  Resp 15 03/02/24 11:12  SpO2 95 % 03/02/24 11:12  Vitals shown include unfiled device data.  Last Pain: There were no vitals filed for this visit.       Complications: No notable events documented.

## 2024-03-02 NOTE — Interval H&P Note (Signed)
 History and Physical Interval Note:  03/02/2024 10:38 AM  Alison Stein  has presented today for surgery, with the diagnosis of Colon cancer screening.  The various methods of treatment have been discussed with the patient and family. After consideration of risks, benefits and other options for treatment, the patient has consented to  Procedure(s): COLONOSCOPY (N/A) as a surgical intervention.  The patient's history has been reviewed, patient examined, no change in status, stable for surgery.  I have reviewed the patient's chart and labs.  Questions were answered to the patient's satisfaction.     Shane Darling  Ok to proceed with colonoscopy

## 2024-03-04 LAB — SURGICAL PATHOLOGY

## 2024-04-11 ENCOUNTER — Other Ambulatory Visit: Payer: Self-pay | Admitting: Family Medicine

## 2024-04-11 DIAGNOSIS — B351 Tinea unguium: Secondary | ICD-10-CM

## 2024-04-13 NOTE — Telephone Encounter (Signed)
 Requested medication (s) are due for refill today: yes  Requested medication (s) are on the active medication list: yes  Last refill:  01/20/24  Future visit scheduled: Yes  Notes to clinic:   Medication not assigned to a protocol, review manually.      Requested Prescriptions  Pending Prescriptions Disp Refills   terbinafine  (LAMISIL ) 250 MG tablet [Pharmacy Med Name: TERBINAFINE  HCL 250 MG TABLET] 30 tablet 2    Sig: Take 1 tablet (250 mg total) by mouth daily. For up to 3 months     Off-Protocol Failed - 04/13/2024  2:50 PM      Failed - Medication not assigned to a protocol, review manually.      Passed - Valid encounter within last 12 months    Recent Outpatient Visits           2 months ago Acute pharyngitis, unspecified etiology   [facility] [place], Marsa PARAS, DO   2 months ago Acute non-recurrent frontal sinusitis   [facility] [place], Marsa PARAS, DO   4 months ago Annual physical exam   [place] [facility] [place], Marsa PARAS, [place]

## 2024-12-02 ENCOUNTER — Other Ambulatory Visit

## 2024-12-09 ENCOUNTER — Encounter: Admitting: Family Medicine
